# Patient Record
Sex: Female | Born: 1975 | Race: Black or African American | Hispanic: No | Marital: Married | Smoking: Never smoker
Health system: Southern US, Community
[De-identification: ages and names within clinical notes are randomized; demographics above are authoritative.]

---

## 2002-10-06 ENCOUNTER — Emergency Department (HOSPITAL_COMMUNITY): Admission: EM | Admit: 2002-10-06 | Discharge: 2002-10-06 | Payer: Self-pay | Admitting: Emergency Medicine

## 2003-09-28 ENCOUNTER — Encounter: Admission: RE | Admit: 2003-09-28 | Discharge: 2004-04-05 | Payer: Self-pay | Admitting: Obstetrics and Gynecology

## 2004-04-29 ENCOUNTER — Encounter: Admission: RE | Admit: 2004-04-29 | Discharge: 2004-11-26 | Payer: Self-pay | Admitting: Obstetrics and Gynecology

## 2005-12-28 ENCOUNTER — Inpatient Hospital Stay (HOSPITAL_COMMUNITY): Admission: AD | Admit: 2005-12-28 | Discharge: 2005-12-29 | Payer: Self-pay | Admitting: Gynecology

## 2006-08-13 ENCOUNTER — Inpatient Hospital Stay (HOSPITAL_COMMUNITY): Admission: AD | Admit: 2006-08-13 | Discharge: 2006-08-15 | Payer: Self-pay | Admitting: Family Medicine

## 2006-08-13 ENCOUNTER — Ambulatory Visit: Payer: Self-pay | Admitting: *Deleted

## 2006-11-08 ENCOUNTER — Encounter: Admission: RE | Admit: 2006-11-08 | Discharge: 2006-12-08 | Payer: Self-pay | Admitting: Obstetrics and Gynecology

## 2006-12-09 ENCOUNTER — Encounter: Admission: RE | Admit: 2006-12-09 | Discharge: 2007-01-07 | Payer: Self-pay | Admitting: Obstetrics and Gynecology

## 2007-01-08 ENCOUNTER — Encounter: Admission: RE | Admit: 2007-01-08 | Discharge: 2007-02-07 | Payer: Self-pay | Admitting: Obstetrics and Gynecology

## 2007-02-08 ENCOUNTER — Encounter: Admission: RE | Admit: 2007-02-08 | Discharge: 2007-03-09 | Payer: Self-pay | Admitting: Obstetrics and Gynecology

## 2007-03-10 ENCOUNTER — Encounter: Admission: RE | Admit: 2007-03-10 | Discharge: 2007-04-09 | Payer: Self-pay | Admitting: Obstetrics and Gynecology

## 2007-04-10 ENCOUNTER — Encounter: Admission: RE | Admit: 2007-04-10 | Discharge: 2007-05-10 | Payer: Self-pay | Admitting: Obstetrics and Gynecology

## 2007-05-11 ENCOUNTER — Encounter: Admission: RE | Admit: 2007-05-11 | Discharge: 2007-06-04 | Payer: Self-pay | Admitting: Obstetrics and Gynecology

## 2007-06-08 ENCOUNTER — Encounter: Admission: RE | Admit: 2007-06-08 | Discharge: 2007-07-08 | Payer: Self-pay | Admitting: Obstetrics and Gynecology

## 2007-07-09 ENCOUNTER — Encounter: Admission: RE | Admit: 2007-07-09 | Discharge: 2007-08-07 | Payer: Self-pay | Admitting: Obstetrics and Gynecology

## 2007-08-08 ENCOUNTER — Encounter: Admission: RE | Admit: 2007-08-08 | Discharge: 2007-09-07 | Payer: Self-pay | Admitting: Obstetrics and Gynecology

## 2007-09-08 ENCOUNTER — Encounter: Admission: RE | Admit: 2007-09-08 | Discharge: 2007-10-07 | Payer: Self-pay | Admitting: Obstetrics and Gynecology

## 2007-10-08 ENCOUNTER — Encounter: Admission: RE | Admit: 2007-10-08 | Discharge: 2007-11-01 | Payer: Self-pay | Admitting: Obstetrics and Gynecology

## 2008-08-04 ENCOUNTER — Emergency Department (HOSPITAL_COMMUNITY): Admission: EM | Admit: 2008-08-04 | Discharge: 2008-08-04 | Payer: Self-pay | Admitting: Emergency Medicine

## 2008-12-03 ENCOUNTER — Encounter (INDEPENDENT_AMBULATORY_CARE_PROVIDER_SITE_OTHER): Payer: Self-pay | Admitting: Specialist

## 2008-12-03 ENCOUNTER — Ambulatory Visit (HOSPITAL_BASED_OUTPATIENT_CLINIC_OR_DEPARTMENT_OTHER): Admission: RE | Admit: 2008-12-03 | Discharge: 2008-12-04 | Payer: Self-pay | Admitting: Specialist

## 2010-07-12 LAB — POCT HEMOGLOBIN-HEMACUE: Hemoglobin: 14.1 g/dL (ref 12.0–15.0)

## 2010-08-19 NOTE — Op Note (Signed)
NAMEGENNIFER, POTENZA                 ACCOUNT NO.:  [id number]   MEDICAL RECORD NO.:  [id number]          PATIENT TYPE:  AMB   LOCATION:  DSC                          FACILITY:  [facility]   PHYSICIAN:  Earvin Hansen L. Shon Hough, M.D.DATE OF BIRTH:  1975-10-06   DATE OF PROCEDURE:  12/03/2008  DATE OF DISCHARGE:                               OPERATIVE REPORT   SURGEON:  Earvin Hansen L. Shon Hough, MD   INDICATIONS:  A 35 year old with severe, severe macromastia; back  shoulder pain secondary to large pendulous breasts.  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  She has failed conservative treatment.   PROCEDURES:  Planned bilateral breast reductions using the inferior  pedicle technique and excision of accessory breast tissue.   ANESTHESIA:  General.   PROCEDURE:  Preoperatively, the patient was sat up and drawn for the  inferior pedicle reduction mammoplasty.  She underwent general  anesthesia, intubated orally.  Prep was done to the chest, breast areas  in a routine fashion using Hibiclens soap and solution, walled off with  sterile towels and drapes so as to make a sterile field.  The areas were  injected with Xylocaine with epinephrine one-fourth concentration  1:400,000, 50 mL per side.  This was allowed to sit up.  The wounds were  scored with a #15 blade.  The skin of the inferior pedicle was de-  epithelialized with a #20 blade.  Medial and lateral fatty dermal  pedicles were incised down to underlying fascia.  The new keyhole area  was also debulked.  After proper hemostasis, the flaps were advanced and  stayed with 3-0 Prolene.  Accessory breast tissue also removed sharply  laterally.  Subcutaneous closure was done with 3-0 Monocryl x2 layers  and then running subcuticular stitch of 3-0 Monocryl and 5-0 Monocryl  throughout the inverted T.  The wounds were drained with #10 fully  fluted Blake drains, which were placed in the depths of the wound and  brought out through the lateral-most portion  of the incision and secured  with 3-0 Prolene.  The wounds were cleansed.  Steri-Strips and soft  dressing were applied to all the areas.  Nipple-areola complexes were  examined, showing excellent symmetry and suppleness and blood supply.  Steri-Strips, soft dressing, Xeroform, 4 x 4s, ABDs, Hypafix tape were  placed.  She withstood the procedures very well, was taken to recovery  in excellent condition.      Yaakov Guthrie. Shon Hough, M.D.  Electronically Signed     GLT/MEDQ  D:  12/03/2008  T:  12/04/2008  Job:  [id number]

## 2011-07-08 ENCOUNTER — Emergency Department (HOSPITAL_COMMUNITY)
Admission: EM | Admit: 2011-07-08 | Discharge: 2011-07-08 | Disposition: A | Discharge status: Home / Self Care | Payer: BC Managed Care – PPO | Attending: Emergency Medicine | Admitting: Emergency Medicine

## 2011-07-08 ENCOUNTER — Emergency Department (HOSPITAL_COMMUNITY): Payer: BC Managed Care – PPO

## 2011-07-08 ENCOUNTER — Encounter (HOSPITAL_COMMUNITY): Payer: Self-pay | Admitting: Emergency Medicine

## 2011-07-08 DIAGNOSIS — N200 Calculus of kidney: Secondary | ICD-10-CM

## 2011-07-08 DIAGNOSIS — N201 Calculus of ureter: Secondary | ICD-10-CM | POA: Insufficient documentation

## 2011-07-08 DIAGNOSIS — R109 Unspecified abdominal pain: Secondary | ICD-10-CM | POA: Insufficient documentation

## 2011-07-08 LAB — URINALYSIS, ROUTINE W REFLEX MICROSCOPIC
Leukocytes, UA: NEGATIVE
Nitrite: NEGATIVE
Protein, ur: 30 mg/dL — AB
Urobilinogen, UA: 0.2 mg/dL (ref 0.0–1.0)

## 2011-07-08 LAB — URINE MICROSCOPIC-ADD ON

## 2011-07-08 LAB — PREGNANCY, URINE: Preg Test, Ur: NEGATIVE

## 2011-07-08 MED ORDER — HYDROMORPHONE HCL PF 1 MG/ML IJ SOLN
1.0000 mg | Freq: Once | INTRAMUSCULAR | Status: AC
  Administered 2011-07-08: 1 mg via INTRAVENOUS
  Filled 2011-07-08: qty 1

## 2011-07-08 MED ORDER — OXYCODONE-ACETAMINOPHEN 5-325 MG PO TABS: 1.0000 | ORAL_TABLET | Freq: Four times a day (QID) | ORAL | Status: AC | PRN

## 2011-07-08 MED ORDER — ONDANSETRON HCL 4 MG/2ML IJ SOLN
4.0000 mg | Freq: Once | INTRAMUSCULAR | Status: AC
  Administered 2011-07-08: 4 mg via INTRAVENOUS
  Filled 2011-07-08: qty 2

## 2011-07-08 MED ORDER — SODIUM CHLORIDE 0.9 % IV BOLUS (SEPSIS)
1000.0000 mL | Freq: Once | INTRAVENOUS | Status: AC
  Administered 2011-07-08: 1000 mL via INTRAVENOUS

## 2011-07-08 MED ORDER — ONDANSETRON 4 MG PO TBDP
8.0000 mg | ORAL_TABLET | Freq: Once | ORAL | Status: AC
  Administered 2011-07-08: 8 mg via ORAL
  Filled 2011-07-08: qty 2

## 2011-07-08 MED ORDER — KETOROLAC TROMETHAMINE 30 MG/ML IJ SOLN
30.0000 mg | Freq: Once | INTRAMUSCULAR | Status: AC
  Administered 2011-07-08: 30 mg via INTRAVENOUS
  Filled 2011-07-08: qty 1

## 2011-07-08 MED ORDER — ONDANSETRON 4 MG PO TBDP
ORAL_TABLET | ORAL | Status: AC
  Filled 2011-07-08: qty 2

## 2011-07-08 NOTE — Discharge Instructions (Signed)
Return to the ED with any concerns including vomiting, fever/chills, worsening pain, decreased level of alertness/lethargy, or any other alarming symptoms °

## 2011-07-08 NOTE — ED Provider Notes (Signed)
Pt signed out to me by Dr. Adriana Simas- her CT scan show small left sided renal stone that has passed into the bladder.  Pt reassessed, she has no further pain.  Discharged with strict return precautions.  Pt agreeable with plan.  Ethelda Chick, MD 07/08/11 [phone]

## 2011-07-08 NOTE — ED Notes (Signed)
Pt was woken up with left flank pain radiating to leg; nauseated; pain is constant; thought she had to urinate at first but pain became too great. Pt has never had this kind of pain before.

## 2011-07-08 NOTE — ED Provider Notes (Addendum)
History     CSN: [id number]  Arrival date & time 07/08/11  0436   First MD Initiated Contact with Patient 07/08/11 0503      Chief Complaint  Patient presents with  . Flank Pain    (Consider location/radiation/quality/duration/timing/severity/associated sxs/prior treatment) HPI.... abrupt onset left flank pain this evening. Decreased urinary output. No dysuria, fever, chills. No previous history of kidney stone. No abdominal pain. Nothing makes it better or worse. Pain is severe.  No past medical history on file.  No past surgical history on file.  No family history on file.  History  Substance Use Topics  . Smoking status: Not on file  . Smokeless tobacco: Not on file  . Alcohol Use: Not on file    OB History    Grav Para Term Preterm Abortions TAB SAB Ect Mult Living                  Review of Systems  All other systems reviewed and are negative.    Allergies  Review of patient's allergies indicates no known allergies.  Home Medications  No current outpatient prescriptions on file.  BP 113/74  Pulse 87  Temp(Src) 98.2 F (36.8 C) (Oral)  Resp 18  SpO2 100%  Physical Exam  Nursing note and vitals reviewed. Constitutional: She is oriented to person, place, and time. She appears well-developed and well-nourished.  HENT:  Head: Normocephalic and atraumatic.  Eyes: Conjunctivae and EOM are normal. Pupils are equal, round, and reactive to light.  Neck: Normal range of motion. Neck supple.  Cardiovascular: Normal rate and regular rhythm.   Pulmonary/Chest: Effort normal and breath sounds normal.  Abdominal: Soft. Bowel sounds are normal.  Genitourinary:       Left flank tenderness  Musculoskeletal: Normal range of motion.  Neurological: She is alert and oriented to person, place, and time.  Skin: Skin is warm and dry.  Psychiatric: She has a normal mood and affect.    ED Course  Procedures (including critical care time)   Labs Reviewed    PREGNANCY, URINE  URINALYSIS, ROUTINE W REFLEX MICROSCOPIC   No results found.   No diagnosis found.    MDM  Suspect kidney stone. Will get urinalysis and pregnancy test. Treat pain. CT scan of abdomen and pelvis without contrast.  Discussed c Dr Darra Lis, MD 07/08/11 4098  Donnetta Hutching, MD 07/08/11 [phone]

## 2011-09-01 ENCOUNTER — Other Ambulatory Visit: Payer: Self-pay | Admitting: Obstetrics and Gynecology

## 2011-09-01 ENCOUNTER — Other Ambulatory Visit (HOSPITAL_COMMUNITY)
Admission: RE | Admit: 2011-09-01 | Discharge: 2011-09-01 | Disposition: A | Discharge status: Home / Self Care | Payer: BC Managed Care – PPO | Source: Ambulatory Visit | Attending: Obstetrics and Gynecology | Admitting: Obstetrics and Gynecology

## 2011-09-01 DIAGNOSIS — Z124 Encounter for screening for malignant neoplasm of cervix: Secondary | ICD-10-CM | POA: Insufficient documentation

## 2016-08-11 ENCOUNTER — Encounter (HOSPITAL_COMMUNITY): Payer: Self-pay | Admitting: Emergency Medicine

## 2016-08-11 ENCOUNTER — Ambulatory Visit (HOSPITAL_COMMUNITY)
Admission: EM | Admit: 2016-08-11 | Discharge: 2016-08-11 | Disposition: A | Discharge status: Nursing Home (Includes ICF) | Payer: BLUE CROSS/BLUE SHIELD

## 2016-08-11 DIAGNOSIS — R42 Dizziness and giddiness: Secondary | ICD-10-CM

## 2016-08-11 DIAGNOSIS — R110 Nausea: Secondary | ICD-10-CM | POA: Diagnosis not present

## 2016-08-11 NOTE — Discharge Instructions (Signed)
Go to ER

## 2016-08-11 NOTE — ED Provider Notes (Signed)
CSN: [id number]     Arrival date & time 08/11/16  1937 History   None    Chief Complaint  Patient presents with  . Nausea   (Consider location/radiation/quality/duration/timing/severity/associated sxs/prior Treatment) 41 yr old AA female presents to ER with cc of sudden onset of nausea, dizziness and inability to walk straight this afternoon. Pt denies CP or visual disturbance. Pt states gait imbalance and dizziness have resolved, nausea remains. Denies pregnancy, LMP 2 weeks prior and husband had vasectomy. Pt reports chronic anemia of unknown cause taking iron pills twice daily, no labs or follow up of anemia x 2 years.   The history is provided by the patient and a relative. No language interpreter was used.    History reviewed. No pertinent past medical history. History reviewed. No pertinent surgical history. History reviewed. No pertinent family history. Social History  Substance Use Topics  . Smoking status: Never Smoker  . Smokeless tobacco: Never Used  . Alcohol use No   OB History    No data available     Review of Systems  Gastrointestinal: Positive for nausea.  Musculoskeletal: Positive for gait problem.  Neurological: Positive for dizziness.  All other systems reviewed and are negative.   Allergies  Patient has no known allergies.  Home Medications   Prior to Admission medications   Not on File   Meds Ordered and Administered this Visit  Medications - No data to display  BP 119/82 (BP Location: Right Arm)   Pulse 78   Temp 98.5 F (36.9 C) (Oral)   Resp 16   LMP 07/29/2015 (Exact Date)   SpO2 100%  No data found.   Physical Exam  Constitutional: She is oriented to person, place, and time. She appears well-developed and well-nourished. She is active and cooperative.  HENT:  Head: Normocephalic.  Right Ear: Tympanic membrane normal.  Left Ear: Tympanic membrane normal.  Mouth/Throat: Uvula is midline, oropharynx is clear and moist and mucous  membranes are normal.  Eyes: Pupils are equal, round, and reactive to light.  Neck: Normal range of motion.  Cardiovascular: Normal rate, regular rhythm and normal pulses.   Murmur heard. Pulmonary/Chest: Effort normal.  Musculoskeletal: Normal range of motion.  Neurological: She is alert and oriented to person, place, and time.  Skin: Skin is warm.  Nursing note and vitals reviewed.   Urgent Care Course     Procedures (including critical care time)  Labs Review Labs Reviewed - No data to display  Imaging Review No results found.        MDM   1. Nausea   2. Dizziness    Discussed possible DDX: severe anemia, TIA, cardiac event, viral illness, etc. Recommend further evaluation in ER for labs/imaging. Pt verbalized udnerstanding ot thsi provider, discharged to ER, husband accompanied.    Clancy Gourdefelice, Atiba Kimberlin, NP 08/11/16 2146

## 2016-08-11 NOTE — ED Triage Notes (Signed)
[facility] with a complaint of a sudden onset of nausea and dizziness that started this afternoon. The patient denied any dizziness at this time.

## 2019-04-20 ENCOUNTER — Other Ambulatory Visit: Payer: Self-pay

## 2019-04-20 ENCOUNTER — Emergency Department (HOSPITAL_BASED_OUTPATIENT_CLINIC_OR_DEPARTMENT_OTHER)
Admission: EM | Admit: 2019-04-20 | Discharge: 2019-04-20 | Disposition: A | Discharge status: Home / Self Care | Payer: BC Managed Care – PPO | Attending: Emergency Medicine | Admitting: Emergency Medicine

## 2019-04-20 ENCOUNTER — Encounter (HOSPITAL_BASED_OUTPATIENT_CLINIC_OR_DEPARTMENT_OTHER): Payer: Self-pay | Admitting: Emergency Medicine

## 2019-04-20 ENCOUNTER — Emergency Department (HOSPITAL_BASED_OUTPATIENT_CLINIC_OR_DEPARTMENT_OTHER): Payer: BC Managed Care – PPO

## 2019-04-20 DIAGNOSIS — R1031 Right lower quadrant pain: Secondary | ICD-10-CM | POA: Diagnosis present

## 2019-04-20 DIAGNOSIS — N201 Calculus of ureter: Secondary | ICD-10-CM | POA: Diagnosis not present

## 2019-04-20 LAB — URINALYSIS, ROUTINE W REFLEX MICROSCOPIC
Bilirubin Urine: NEGATIVE
Glucose, UA: NEGATIVE mg/dL
Ketones, ur: 15 mg/dL — AB
Leukocytes,Ua: NEGATIVE
Nitrite: NEGATIVE
Protein, ur: NEGATIVE mg/dL
Specific Gravity, Urine: 1.02 (ref 1.005–1.030)
pH: 7.5 (ref 5.0–8.0)

## 2019-04-20 LAB — URINALYSIS, MICROSCOPIC (REFLEX): RBC / HPF: >50 RBC/hpf (ref 0–5)

## 2019-04-20 LAB — PREGNANCY, URINE: Preg Test, Ur: NEGATIVE

## 2019-04-20 MED ORDER — ONDANSETRON HCL 4 MG/2ML IJ SOLN
4.0000 mg | Freq: Once | INTRAMUSCULAR | Status: AC
  Administered 2019-04-20: 4 mg via INTRAVENOUS
  Filled 2019-04-20: qty 2

## 2019-04-20 MED ORDER — ONDANSETRON 8 MG PO TBDP: 8.0000 mg | ORAL_TABLET | Freq: Three times a day (TID) | ORAL | 0 refills | Status: DC | PRN

## 2019-04-20 MED ORDER — HYDROMORPHONE HCL 2 MG PO TABS: 2.0000 mg | ORAL_TABLET | ORAL | 0 refills | Status: DC | PRN

## 2019-04-20 MED ORDER — PROMETHAZINE HCL 25 MG/ML IJ SOLN
12.5000 mg | Freq: Once | INTRAMUSCULAR | Status: AC
  Administered 2019-04-20: 03:00:00 12.5 mg via INTRAVENOUS

## 2019-04-20 MED ORDER — HYDROMORPHONE HCL 1 MG/ML IJ SOLN
1.0000 mg | Freq: Once | INTRAMUSCULAR | Status: AC
  Administered 2019-04-20: 1 mg via INTRAVENOUS
  Filled 2019-04-20: qty 1

## 2019-04-20 MED ORDER — KETOROLAC TROMETHAMINE 15 MG/ML IJ SOLN
15.0000 mg | Freq: Once | INTRAMUSCULAR | Status: AC
  Administered 2019-04-20: 15 mg via INTRAVENOUS
  Filled 2019-04-20: qty 1

## 2019-04-20 MED ORDER — HYDROMORPHONE HCL 1 MG/ML IJ SOLN
1.0000 mg | Freq: Once | INTRAMUSCULAR | Status: AC
  Administered 2019-04-20: 03:00:00 1 mg via INTRAVENOUS
  Filled 2019-04-20: qty 1

## 2019-04-20 MED ORDER — PROMETHAZINE HCL 25 MG/ML IJ SOLN
INTRAMUSCULAR | Status: AC
  Filled 2019-04-20: qty 1

## 2019-04-20 NOTE — ED Triage Notes (Signed)
  Patient comes in with R flank pain and possible kidney stone.  Patient states she has had kidney stones before and it felt like this.  Had same pain a few weeks ago that went away and started yesterday with R flank pain and N/V.  Patient states she was taking 800 mg ibuprofen for pain and last emesis was around 0030.  No painful urination but urine concentrated.  Pain 10/10

## 2019-04-20 NOTE — ED Provider Notes (Signed)
[facility] Provider Note: Georgena Spurling, MD, FACEP  CSN: [id number] MRN: [id number] ARRIVAL: 04/20/19 at 0200 ROOM: MH06/MH06   CHIEF COMPLAINT  Flank Pain   HISTORY OF PRESENT ILLNESS  04/20/19 2:31 AM Kendra Cordova is a 44 y.o. female with a history of ureterolithiasis.  She is here with right flank pain that began about 2 weeks ago.  Subsequently subsided but returned yesterday evening about 6 PM.  She had some transient relief with ibuprofen but the pain worsened about 11 PM.  Again she took ibuprofen but then began vomiting.  She rates her pain is a 10 out of 10 and describes it as throbbing and shooting.  It is similar to previous kidney stone pain.   History reviewed. No pertinent past medical history.  History reviewed. No pertinent surgical history.  History reviewed. No pertinent family history.  Social History   Tobacco Use  . Smoking status: Never Smoker  . Smokeless tobacco: Never Used  Substance Use Topics  . Alcohol use: No  . Drug use: No    Prior to Admission medications   Medication Sig Start Date End Date Taking? Authorizing Provider  HYDROmorphone (DILAUDID) 2 MG tablet Take 1 tablet (2 mg total) by mouth every 4 (four) hours as needed for severe pain. 04/20/19   Paula Zietz, MD  ondansetron (ZOFRAN ODT) 8 MG disintegrating tablet Take 1 tablet (8 mg total) by mouth every 8 (eight) hours as needed for nausea or vomiting. 04/20/19   Makalya Nave, Jenny Reichmann, MD    Allergies Patient has no known allergies.   REVIEW OF SYSTEMS  Negative except as noted here or in the History of Present Illness.   PHYSICAL EXAMINATION  Initial Vital Signs Blood pressure (!) 142/90, pulse 92, temperature 98.8 F (37.1 C), temperature source Oral, resp. rate 20, height 5\' 1"  (1.549 m), weight 68 kg, last menstrual period 03/29/2019, SpO2 100 %.  Examination General: Well-developed, well-nourished female in no acute distress; appearance consistent with age of record  HENT: normocephalic; atraumatic Eyes: Normal appearance Neck: supple Heart: regular rate and rhythm Lungs: clear to auscultation bilaterally Abdomen: soft; nondistended; nontender; bowel sounds present GU: No CVA tenderness Extremities: No deformity; full range of motion Neurologic: Awake, alert and oriented; motor function intact in all extremities and symmetric; no facial droop Skin: Warm and dry Psychiatric: Grimacing   RESULTS  Summary of this visit's results, reviewed and interpreted by myself:   EKG Interpretation  Date/Time:    Ventricular Rate:    PR Interval:    QRS Duration:   QT Interval:    QTC Calculation:   R Axis:     Text Interpretation:        Laboratory Studies: Results for orders placed or performed during the hospital encounter of 04/20/19 (from the past 24 hour(s))  Urinalysis, Routine w reflex microscopic     Status: Abnormal   Collection Time: 04/20/19  3:08 AM  Result Value Ref Range   Color, Urine YELLOW YELLOW   APPearance CLOUDY (A) CLEAR   Specific Gravity, Urine 1.020 1.005 - 1.030   pH 7.5 5.0 - 8.0   Glucose, UA NEGATIVE NEGATIVE mg/dL   Hgb urine dipstick LARGE (A) NEGATIVE   Bilirubin Urine NEGATIVE NEGATIVE   Ketones, ur 15 (A) NEGATIVE mg/dL   Protein, ur NEGATIVE NEGATIVE mg/dL   Nitrite NEGATIVE NEGATIVE   Leukocytes,Ua NEGATIVE NEGATIVE  Pregnancy, urine     Status: None   Collection Time: 04/20/19  3:08 AM  Result Value Ref Range   Preg Test, Ur NEGATIVE NEGATIVE  Urinalysis, Microscopic (reflex)     Status: Abnormal   Collection Time: 04/20/19  3:08 AM  Result Value Ref Range   RBC / HPF >50 0 - 5 RBC/hpf   WBC, UA 0-5 0 - 5 WBC/hpf   Bacteria, UA MANY (A) NONE SEEN   Squamous Epithelial / LPF 0-5 0 - 5   Imaging Studies: CT Renal Stone Study  Result Date: 04/20/2019 CLINICAL DATA:  Right-sided flank pain EXAM: CT ABDOMEN AND PELVIS WITHOUT CONTRAST TECHNIQUE: Multidetector CT imaging of the abdomen and pelvis was  performed following the standard protocol without IV contrast. COMPARISON:  None. FINDINGS: Lower chest: The visualized heart size within normal limits. No pericardial fluid/thickening. No hiatal hernia. Mild bibasilar dependent atelectasis is seen. Hepatobiliary: Although limited due to the lack of intravenous contrast, normal in appearance without gross focal abnormality. No evidence of calcified gallstones or biliary ductal dilatation. Pancreas:  Unremarkable.  No surrounding inflammatory changes. Spleen: Normal in size. Although limited due to the lack of intravenous contrast, normal in appearance. Adrenals/Urinary Tract: Both adrenal glands appear normal. There is a punctate calcification upper pole of the right kidney. A 2 mm calculus seen in the distal right ureter at the UVJ. There is mild right pelviectasis seen. Scattered multiple small calcifications seen within the left kidney the largest measuring 2 mm in the lower pole. The bladder is decompressed. Stomach/Bowel: The stomach, small bowel, and colon are normal in appearance. No inflammatory changes or obstructive findings. appendix is normal. Vascular/Lymphatic: There are no enlarged abdominal or pelvic lymph nodes. No significant gross vascular findings are present. Reproductive: The uterus and adnexa are unremarkable. Other: Small fat containing anterior umbilical hernia noted. Musculoskeletal: No acute or significant osseous findings. IMPRESSION: 2 mm distal right ureteral calculus at the UVJ causing mild right pelviectasis. Nonobstructing multiple left renal calculi. Electronically Signed   By: Jonna Clark M.D.   On: 04/20/2019 03:59    ED COURSE and MDM  Nursing notes, initial and subsequent vitals signs, including pulse oximetry, reviewed and interpreted by myself.  Vitals:   04/20/19 0208 04/20/19 0209 04/20/19 0300 04/20/19 0445  BP: (!) 142/90  107/72 140/88  Pulse: 92  61 90  Resp: 20   20  Temp: 98.8 F (37.1 C)   98.4 F (36.9  C)  TempSrc: Oral   Oral  SpO2: 100%  (!) 82% 98%  Weight:  68 kg    Height:  5\' 1"  (1.549 m)     Medications  HYDROmorphone (DILAUDID) injection 1 mg (has no administration in time range)  ketorolac (TORADOL) 15 MG/ML injection 15 mg (has no administration in time range)  ondansetron (ZOFRAN) injection 4 mg (4 mg Intravenous Given 04/20/19 0231)  HYDROmorphone (DILAUDID) injection 1 mg (1 mg Intravenous Given 04/20/19 0231)  promethazine (PHENERGAN) injection 12.5 mg (12.5 mg Intravenous Given 04/20/19 0320)   4:59 AM Pain and nausea well controlled at this time.  Stone is small enough the patient will likely pass it without intervention but we will refer to urology  PROCEDURES  Procedures   ED DIAGNOSES     ICD-10-CM   1. Ureterolithiasis  N20.1        Zivah Mayr, MD 04/20/19 [phone]

## 2019-06-08 ENCOUNTER — Other Ambulatory Visit: Payer: Self-pay

## 2019-06-08 ENCOUNTER — Ambulatory Visit: Payer: BC Managed Care – PPO | Attending: Family

## 2019-06-08 DIAGNOSIS — Z23 Encounter for immunization: Secondary | ICD-10-CM | POA: Insufficient documentation

## 2019-06-08 NOTE — Progress Notes (Signed)
   Covid-19 Vaccination Clinic  Name:  Kendra Cordova    MRN: [id number] DOB: 1975-05-13  06/08/2019  Kendra Cordova was observed post Covid-19 immunization for 15 minutes without incident. She was provided with Vaccine Information Sheet and instruction to access the V-Safe system.   Kendra Cordova was instructed to call 911 with any severe reactions post vaccine: Marland Kitchen Difficulty breathing  . Swelling of face and throat  . A fast heartbeat  . A bad rash all over body  . Dizziness and weakness   Immunizations Administered    Name Date Dose VIS Date Route   Moderna COVID-19 Vaccine 06/08/2019  3:08 PM 0.5 mL 03/07/2019 Intramuscular   Manufacturer: Moderna   Lot: [id number]   NDC: 87681-157-26

## 2019-07-03 ENCOUNTER — Other Ambulatory Visit: Payer: Self-pay | Admitting: Family Medicine

## 2019-07-03 DIAGNOSIS — R1032 Left lower quadrant pain: Secondary | ICD-10-CM

## 2019-07-03 DIAGNOSIS — R3915 Urgency of urination: Secondary | ICD-10-CM

## 2019-07-05 ENCOUNTER — Other Ambulatory Visit: Payer: Self-pay

## 2019-07-05 ENCOUNTER — Ambulatory Visit (INDEPENDENT_AMBULATORY_CARE_PROVIDER_SITE_OTHER): Payer: BC Managed Care – PPO

## 2019-07-05 DIAGNOSIS — R1032 Left lower quadrant pain: Secondary | ICD-10-CM | POA: Diagnosis not present

## 2019-07-05 DIAGNOSIS — R3915 Urgency of urination: Secondary | ICD-10-CM

## 2019-07-05 MED ORDER — IOHEXOL 300 MG/ML  SOLN
100.0000 mL | Freq: Once | INTRAMUSCULAR | Status: AC | PRN
  Administered 2019-07-05: 09:00:00 100 mL via INTRAVENOUS

## 2019-07-11 ENCOUNTER — Ambulatory Visit: Payer: BC Managed Care – PPO | Attending: Family

## 2019-07-11 DIAGNOSIS — Z23 Encounter for immunization: Secondary | ICD-10-CM

## 2019-07-11 NOTE — Progress Notes (Signed)
   Covid-19 Vaccination Clinic  Name:  Vanessia Bokhari    MRN: [id number] DOB: 1975-06-30  07/11/2019  Ms. Gulbranson was observed post Covid-19 immunization for 15 minutes without incident. She was provided with Vaccine Information Sheet and instruction to access the V-Safe system.   Ms. Hashimi was instructed to call 911 with any severe reactions post vaccine: Marland Kitchen Difficulty breathing  . Swelling of face and throat  . A fast heartbeat  . A bad rash all over body  . Dizziness and weakness   Immunizations Administered    Name Date Dose VIS Date Route   Moderna COVID-19 Vaccine 07/11/2019  1:41 PM 0.5 mL 03/07/2019 Intramuscular   Manufacturer: Moderna   Lot: [id number]   NDC: 45364-680-32

## 2020-02-07 ENCOUNTER — Ambulatory Visit: Payer: BC Managed Care – PPO | Attending: Family

## 2020-02-07 DIAGNOSIS — Z23 Encounter for immunization: Secondary | ICD-10-CM

## 2020-04-20 NOTE — Progress Notes (Signed)
   Covid-19 Vaccination Clinic  Name:  Kendra Cordova    MRN: [id number] DOB: 1975/12/28  04/20/2020  [name] was observed post Covid-19 immunization for 15 minutes without incident. She was provided with Vaccine Information Sheet and instruction to access the V-Safe system.   [name] was instructed to call 911 with any severe reactions post vaccine: Marland Kitchen Difficulty breathing  . Swelling of face and throat  . A fast heartbeat  . A bad rash all over body  . Dizziness and weakness   Immunizations Administered    Name Date Dose VIS Date Route   Moderna Covid-19 Booster Vaccine 02/07/2020  5:50 PM 0.25 mL 01/24/2020 Intramuscular   Manufacturer: Moderna   Lot: [id number]   NDC: 62376-283-15

## 2021-03-06 ENCOUNTER — Ambulatory Visit (INDEPENDENT_AMBULATORY_CARE_PROVIDER_SITE_OTHER): Payer: BC Managed Care – PPO | Admitting: Family Medicine

## 2021-03-06 ENCOUNTER — Encounter: Payer: Self-pay | Admitting: Family Medicine

## 2021-03-06 VITALS — BP 104/70 | Ht 63.0 in | Wt 150.0 lb

## 2021-03-06 DIAGNOSIS — M20012 Mallet finger of left finger(s): Secondary | ICD-10-CM | POA: Diagnosis not present

## 2021-03-06 NOTE — Progress Notes (Signed)
  Kendra Cordova - 45 y.o. female MRN [id number]  Date of birth: Jun 28, 1975  SUBJECTIVE:  Including CC & ROS.  No chief complaint on file.   Kendra Cordova is a 45 y.o. female that is presenting with right pinky finger pain.  The pain has been present for a couple of days.  She was fixing her hair and felt a pop in her finger.  Since that time she has had inability to extend the distal phalanx of the left digit.   Review of Systems See HPI   HISTORY: Past Medical, Surgical, Social, and Family History Reviewed & Updated per EMR.   Pertinent Historical Findings include:  History reviewed. No pertinent past medical history.  History reviewed. No pertinent surgical history.  History reviewed. No pertinent family history.  Social History   Socioeconomic History   Marital status: Married    Spouse name: Not on file   Number of children: Not on file   Years of education: Not on file   Highest education level: Not on file  Occupational History   Not on file  Tobacco Use   Smoking status: Never   Smokeless tobacco: Never  Vaping Use   Vaping Use: Never used  Substance and Sexual Activity   Alcohol use: No   Drug use: No   Sexual activity: Not on file  Other Topics Concern   Not on file  Social History Narrative   Not on file   Social Determinants of Health   Financial Resource Strain: Not on file  Food Insecurity: Not on file  Transportation Needs: Not on file  Physical Activity: Not on file  Stress: Not on file  Social Connections: Not on file  Intimate Partner Violence: Not on file     PHYSICAL EXAM:  VS: BP 104/70 (BP Location: Left Arm, Patient Position: Sitting)   Ht 5\' 3"  (1.6 m)   Wt 150 lb (68 kg)   BMI 26.57 kg/m  Physical Exam Gen: NAD, alert, cooperative with exam, well-appearing     ASSESSMENT & PLAN:   Mallet finger, left Occurring at the fifth digit.   -Counseled on home exercise therapy and supportive care. -Exam splint was placed today.   However the splint does not allow the PIP to flex and extend. -Refer to occupational therapy for a custom splint.

## 2021-03-06 NOTE — Assessment & Plan Note (Signed)
Occurring at the fifth digit.   -Counseled on home exercise therapy and supportive care. -Exam splint was placed today.  However the splint does not allow the PIP to flex and extend. -Refer to occupational therapy for a custom splint.

## 2021-03-06 NOTE — Patient Instructions (Signed)
Nice to meet you Please continue the brace   Please send me a message in MyChart with any questions or updates.  Please see me back in 6 weeks.   --Dr. Jordan Likes

## 2021-03-10 ENCOUNTER — Other Ambulatory Visit: Payer: Self-pay

## 2021-03-10 ENCOUNTER — Ambulatory Visit: Payer: BC Managed Care – PPO | Attending: Family Medicine | Admitting: Occupational Therapy

## 2021-03-10 ENCOUNTER — Encounter: Payer: Self-pay | Admitting: Occupational Therapy

## 2021-03-10 DIAGNOSIS — M79645 Pain in left finger(s): Secondary | ICD-10-CM | POA: Diagnosis present

## 2021-03-10 DIAGNOSIS — R29898 Other symptoms and signs involving the musculoskeletal system: Secondary | ICD-10-CM | POA: Diagnosis present

## 2021-03-10 DIAGNOSIS — M25342 Other instability, left hand: Secondary | ICD-10-CM | POA: Diagnosis not present

## 2021-03-10 NOTE — Therapy (Signed)
[facility] [address] [facility]. [place], [place], [address] Phone: [phone]   Fax:  [phone]  Occupational Therapy Evaluation  Patient Details  Name: Kendra Cordova MRN: [id number] Date of Birth: 15-Jun-1975 Referring Provider (OT): Clare Gandy, MD   Encounter Date: 03/10/2021   OT End of Session - 03/10/21 1110     Visit Number 1    Number of Visits 1    Authorization Type BCBS    Authorization - Visit Number 1    Authorization - Number of Visits 60    OT Start Time 1102    OT Stop Time 1154    OT Time Calculation (min) 52 min    Activity Tolerance Patient tolerated treatment well;No increased pain    Behavior During Therapy [facility] for tasks assessed/performed            History reviewed. No pertinent past medical history.  History reviewed. No pertinent surgical history.  There were no vitals filed for this visit.   Subjective Assessment - 03/10/21 1109     Subjective  Pt arrives to session w/ primary concerns related to mallet finger injury that occurred about a week ago. Pt states she reached up to scratch her head and both her and her coworker heard a pop. She reports previous having a prior mallet finger injury to contralateral side (R) about 3-4 years ago and that she still has the custom-fabricated orthosis that were made for her at that time. Pt also self-purchased options (small 2-pack stack splint and Oval-8 sizes 2-4) and reports she will wear whichever one fits the best.    Pertinent History Mallet finger injury to L little finger (04/02/21)    Limitations Mallet finger injury to R little finger 3-4 years ago    Patient Stated Goals Pt reports she wants to wear whatever brace she needs to to promote healing and that she would like to be able to type w/out significant difficulty for work    Currently in Pain? No/denies             [facility] OT Assessment - 03/10/21 1112       Assessment   Medical Diagnosis  Mallet finger - L little finger    Referring Provider (OT) Clare Gandy, MD    Onset Date/Surgical Date 04/02/21    Hand Dominance Right    Next MD Visit 04/17/21      Precautions   Precautions Other (comment)    Precaution Comments No DIP flexion    Required Braces or Orthoses Other Brace/Splint    Other Brace/Splint Mallet finger orthosis      Prior Function   Level of Independence Independent    Vocation Full time employment    Engineer, agricultural; primarily computer work      ADL   ADL comments Reports no difficulties w/ ADLs      IADL   Light Housekeeping Does personal laundry completely;Maintains house alone or with occasional assistance    Meal Prep Plans, prepares and serves adequate meals independently    Training and development officer own vehicle      Observation/Other Assessments   Observations Demonstrated difficulty typing while wearing custom-fabricated orthosis      Sensation   Light Touch Appears Intact    Hot/Cold Appears Intact    Additional Comments Reports persistent cold fingertip of injured finger      Coordination   Fine Motor Movements are Fluid and Coordinated Yes  Edema   Edema WNL - middle phalanx: 4.2cm; distal 3.9cm      AROM   Overall AROM  Deficits;Unable to assess;Due to precautions      Hand Function   Right Hand Gross Grasp Functional    Left Hand Gross Grasp Functional             OT Treatments/Exercises (OP) - 03/10/21 1304       Splinting   Splinting Fabricated and fitted doral-volar mallet orthosis for L little finger using 3/32" solid thermoplastic material. Pt positioned w/ DIP extended to neutral and was able to flex and extend PIPJ w/out difficulty. Pt instructed to wear orthosis at all times, per MD instruction, educated on care of orthosis, and cautioned to monitor skin for pressure areas or signs of irritation/improper fit; pt verbalized understanding. OT also fit and provided corresponding education  regarding self-purchased prefabricated polypropylene Oval-8 orthosis size 2-3; pt is able to type more efficiently for completion of work-related tasks w/ Oval-8 vs. custom-fabricated orthosis             OT Education - 03/10/21 1226     Education Details Provided condition-specific education and education on purpose of orthosis/precautions, wear and care of orthosis, as well as potential signs and symptoms of irritation or inadequate fit to be aware of    Person(s) Educated Patient    Methods Explanation;Demonstration    Comprehension Verbalized understanding             OT Short Term Goals - 03/10/21 1301       OT SHORT TERM GOAL #1   Title Pt will demonstrate ability to don and doff custom-fabricated orthosis prior to conclusion of session    Status Achieved   03/10/21   Target Date 03/10/21      OT SHORT TERM GOAL #2   Title Pt to verbalize understanding of precautions and wear and care of orthoses prn    Status Achieved   03/10/21   Target Date 03/10/21             Plan - 03/10/21 1229     Clinical Impression Statement Pt is a 45 y.o. female who presents to OP OT 1 week s/p mallet finger injury to L little finger. Pt was referred by Dr. Jordan Likes for fabrication of custom-fabricated mallet finger orthosis to prevent DIP flexion, provide protection, and facilitate healing for several weeks. Kendra Cordova is being treated non-surgically and was most recently placed in a pre-fabricated stack orthosis via Dr. Jordan Likes on 03/06/21.  Pt was ultimately fit in custom dorsal-volar mallet orthosis and self-purchased prefabricatred polypropylene Oval-8 (size 2-3) orthosis to wear while at work; both maintain DIP in neutral to slight extension. After immobilization period, pt will likely not need to return to skilled OT services, but was encouraged to discuss this w/ her MD at follow-up appt on 04/17/21. Pt verbalized understanding and is agreeable to POC at this time.    OT Occupational  Profile and History Problem Focused Assessment - Including review of records relating to presenting problem    Occupational performance deficits (Please refer to evaluation for details): IADL's;Work    Body Structure / Function / Physical Skills Strength;Pain;Dexterity;ROM    Rehab Potential Excellent    Clinical Decision Making Limited treatment options, no task modification necessary    Comorbidities Affecting Occupational Performance: None    Modification or Assistance to Complete Evaluation  No modification of tasks or assist necessary to complete eval    OT  Frequency One time visit    OT Treatment/Interventions Splinting;Patient/family education;Self-care/ADL training    Consulted and Agree with Plan of Care Patient            Patient will benefit from skilled therapeutic intervention in order to improve the following deficits and impairments:   Body Structure / Function / Physical Skills: Strength, Pain, Dexterity, ROM   Visit Diagnosis: Other instability, left hand  Pain in left finger(s)  Other symptoms and signs involving the musculoskeletal system   Problem List Patient Active Problem List   Diagnosis Date Noted   Mallet finger, left 03/06/2021    Kendra Cordova, Kendra Cordova, MSOT  03/10/2021, 1:08 PM  [facility] [address] [facility] [place], [place], [address] Phone: [phone]   Fax:  [phone]  Name: Bassy Fetterly MRN: [id number] Date of Birth: January 10, 1976

## 2021-04-17 ENCOUNTER — Ambulatory Visit: Payer: BC Managed Care – PPO | Admitting: Family Medicine

## 2021-06-16 ENCOUNTER — Ambulatory Visit: Payer: BC Managed Care – PPO | Admitting: Internal Medicine

## 2021-06-16 ENCOUNTER — Encounter: Payer: Self-pay | Admitting: Internal Medicine

## 2021-06-16 ENCOUNTER — Other Ambulatory Visit: Payer: Self-pay

## 2021-06-16 VITALS — BP 114/78 | HR 73 | Temp 98.3°F | Resp 18 | Ht 63.0 in | Wt 154.2 lb

## 2021-06-16 DIAGNOSIS — L299 Pruritus, unspecified: Secondary | ICD-10-CM | POA: Diagnosis not present

## 2021-06-16 DIAGNOSIS — J3089 Other allergic rhinitis: Secondary | ICD-10-CM

## 2021-06-16 DIAGNOSIS — J310 Chronic rhinitis: Secondary | ICD-10-CM | POA: Diagnosis not present

## 2021-06-16 DIAGNOSIS — H1013 Acute atopic conjunctivitis, bilateral: Secondary | ICD-10-CM | POA: Diagnosis not present

## 2021-06-16 DIAGNOSIS — J302 Other seasonal allergic rhinitis: Secondary | ICD-10-CM | POA: Insufficient documentation

## 2021-06-16 MED ORDER — IPRATROPIUM BROMIDE 0.03 % NA SOLN: 2.0000 | Freq: Three times a day (TID) | NASAL | 12 refills | Status: AC | PRN

## 2021-06-16 NOTE — Progress Notes (Signed)
? ?NEW PATIENT ?Date of Service/Encounter:  06/16/21 ?Referring provider: Faustino Congress, NP ?Primary care provider: Marda Stalker, PA-C ? ?Subjective:  ?Kendra Cordova is a 46 y.o. female with a PMHx of migraines with aura, chronic throat clearing presenting today for evaluation of itching. ?History obtained from: chart review and patient. ?  ?Itching occurs on her necks, abdomen and arms.  Comes and goes.  She started taking xyzal for this and it helped.  She does get a rash but not sure if this is from her scratching. ?She always has drainage, and throat clearing.  ?She does cough up clear phlegm in the mornings on occasion, but throat clearing is all day. ?She does have watery eyes, possible itchy nose/runny nose.  ?Symptoms are year round. ? ?Other allergy screening: ?Asthma: no ?Food allergy: no ?Medication allergy: no ?Hymenoptera allergy: no ?Urticaria: no ?Eczema:no ?History of recurrent infections suggestive of immunodeficency: no ?Vaccinations are up to date.  ? ?Past Medical History: ?History reviewed. No pertinent past medical history. ?Medication List:  ?Current Outpatient Medications  ?Medication Sig Dispense Refill  ? levocetirizine (XYZAL) 5 MG tablet SMARTSIG:1 Tablet(s) By Mouth Every Evening    ? ?No current facility-administered medications for this visit.  ? ?Known Allergies:  ?No Known Allergies ?Past Surgical History: ?Past Surgical History:  ?Procedure Laterality Date  ? BREAST REDUCTION SURGERY  2013  ? ?Family History: ?Family History  ?Problem Relation Age of Onset  ? Asthma Father   ? ?Social History: Kendra Cordova lives in a house built in 2014, no water damage, carpet in the bedroom, gas and electric heating, central AC, no pets, no cockroaches, not using dust mite protection on the bedding or pillows, no smoke exposure.  She works as a Careers adviser.  Unsure if home has HEPA filters.  ? ?ROS:  ?All other systems negative except as noted per HPI. ? ?Objective:  ?Blood pressure  114/78, pulse 73, temperature 98.3 ?F (36.8 ?C), temperature source Temporal, resp. rate 18, height 5\' 3"  (1.6 m), weight 154 lb 3.2 oz (69.9 kg), SpO2 99 %. ?Body mass index is 27.32 kg/m?Marland Kitchen ?Physical Exam: ? ?General Appearance:  Alert, cooperative, no distress, appears stated age  ?Head:  Normocephalic, without obvious abnormality, atraumatic  ?Eyes:  Conjunctiva clear, EOM's intact  ?Nose: Nares normal, hypertrophic turbinates, normal mucosa, and no visible anterior polyps  ?Throat: Lips, tongue normal; teeth and gums normal, normal posterior oropharynx and + cobblestoning  ?Neck: Supple, symmetrical  ?Lungs:   clear to auscultation bilaterally, Respirations unlabored, no coughing  ?Heart:  regular rate and rhythm and no murmur, Appears well perfused  ?Extremities: No edema  ?Skin: Skin color, texture, turgor normal, no rashes or lesions on visualized portions of skin  ?Neurologic: No gross deficits  ? ? ? ?Diagnostics: ?Skin Testing: Environmental allergy panel ? Adequate controls. ?Results discussed with patient/family. ? Airborne Adult Perc - 06/16/21 1402   ? ? Time Antigen Placed 1402   Simultaneous filing. User may not have seen previous data.  ? Allergen Manufacturer Greer   Simultaneous filing. User may not have seen previous data.  ? Location Back   Simultaneous filing. User may not have seen previous data.  ? Number of Test 59   Simultaneous filing. User may not have seen previous data.  ? Panel 1 Select   Simultaneous filing. User may not have seen previous data.  ? 1. Control-Buffer 50% Glycerol Negative   ? 2. Control-Histamine 1 mg/ml 4+   ? 3. Albumin saline Negative   ?  4. Grinnell Negative   ? 5. [place] Negative   ? 6. Johnson Negative   ? 7. Patillas Blue Negative   ? 8. Meadow Fescue Negative   ? 9. Perennial Rye Negative   ? 10. Sweet Vernal Negative   ? 11. Timothy Negative   ? 12. Cocklebur Negative   ? 13. Burweed Marshelder Negative   ? 14. Ragweed, short Negative   ? 15. Ragweed, Giant  Negative   ? 16. Plantain,  English Negative   ? 17. Lamb's Quarters Negative   ? 18. Sheep Sorrell Negative   ? 19. Rough Pigweed Negative   ? 20. Marsh Elder, Rough Negative   ? 21. Mugwort, Common Negative   ? 22. Ash mix 3+   ? 23. Wendee Copp mix Negative   ? 24. Beech American Negative   ? 25. Box, Elder 3+   ? 26. Cedar, red Negative   ? 27. Cottonwood, Eastern 3+   ? 28. Elm mix 3+   ? 29. Hickory Negative   ? 30. Maple mix Negative   ? 31. Oak, Russian [place] mix Negative   ? 32. Pecan Pollen Negative   ? 33. Pine mix Negative   ? 34. Sycamore Eastern Negative   ? 35. Walnut, Black Pollen Negative   ? 36. Alternaria alternata Negative   ? 54. Cladosporium Herbarum Negative   ? 38. Aspergillus mix Negative   ? 39. Penicillium mix Negative   ? 40. Bipolaris sorokiniana (Helminthosporium) Negative   ? 41. Drechslera spicifera (Curvularia) Negative   ? 42. Mucor plumbeus Negative   ? 43. Fusarium moniliforme Negative   ? 44. Aureobasidium pullulans (pullulara) Negative   ? 45. Rhizopus oryzae Negative   ? 46. Botrytis cinera Negative   ? 47. Epicoccum nigrum Negative   ? 48. Phoma betae Negative   ? 49. Candida Albicans Negative   ? 50. Trichophyton mentagrophytes Negative   ? 51. Mite, D Farinae  5,000 AU/ml 3+   ? 52. Mite, D Pteronyssinus  5,000 AU/ml 2+   ? 53. Cat Hair 10,000 BAU/ml Negative   ? 54.  Dog Epithelia Negative   ? 55. Mixed Feathers Negative   ? 56. Horse Epithelia Negative   ? 57. Cockroach, [place] Negative   ? 58. Mouse Negative   ? 59. Tobacco Leaf Negative   ? ?  ?  ? ?  ? ? ?Allergy testing results were read and interpreted by myself, documented by clinical staff. ? ?Assessment and Plan  ?Patient with a history of chronic rhinitis and conjunctivitis with chronic throat clearing in addition to new issue of pruritus without a rash. ?Allergy testing today showed both seasonal and perennial allergens.  Plan as below ? ?Chronic  Rhinitis: seasonal and perennial allergic with Itching (pruritus): ?- allergy  testing today was positive to tree pollens and dust mites ?- allergen avoidance as below ?- Chronic throat clearing handout below ?- Consider Nasal Steroid Spray: Options include Flonase SENSIMIST (fluticasone), Nasocort (triamcinolone), Nasonex (mometasome) 1- 2 sprays in each nostril daily (can buy over-the-counter if not covered by insurance)  Best results if used daily.  ?Avoid regular flonase given your history of headaches. ?- Start Atrovent (Ipratropium Bromide) 1-2 sprays in each nostril up to 3 times a day as needed for runny nose/post nasal drip/drainage.  Use less frequently if airway gets too dry. ?- Continue over the counter antihistamine daily or daily as needed.  Can take 2 on days itching is worst ?-Your options  include Zyrtec (Cetirizine) 10mg , Claritin (Loratadine) 10mg , Allegra (Fexofenadine) 180mg , or Xyzal (Levocetirinze) 5mg  ?Use dye free and fragrance free products, sensitive skin products ?Keep skin moisturized with hypoallergenic ointments like CeraVe, Vaseline, Aveeno, Eucerin, Vanicream, etc ? ?Allergic Conjunctivitis:  ?- Consider Allergy Eye drops: great options include Pataday (Olopatadine) or Zaditor (ketotifen) for eye symptoms daily as needed-both sold over the counter if not covered by insurance.   ?-Avoid eye drops that say red eye relief as they may contain medications that dry out your eyes. ? ?Follow-up in 4 months, sooner if needed.  ?It was a pleasure meeting you in clinic today! ? ?This note in its entirety was forwarded to the Provider who requested this consultation. ? ?Thank you for your kind referral. I appreciate the opportunity to take part in [place] care. Please do not hesitate to contact me with questions. ? ?Sincerely, ? ?Sigurd Sos, MD ?[facility] [place] ? ? ? ? ? ?

## 2021-06-16 NOTE — Patient Instructions (Signed)
Chronic  Rhinitis: seasonal and perennial allergic with Itching (pruritus): ?- allergy testing today was positive to tree pollens and dust mites ?- allergen avoidance as below ?- Chronic throat clearing handout below ?- Consider Nasal Steroid Spray: Options include Flonase SENSIMIST (fluticasone), Nasocort (triamcinolone), Nasonex (mometasome) 1- 2 sprays in each nostril daily (can buy over-the-counter if not covered by insurance)  Best results if used daily.  ?Avoid regular flonase given your history of headaches. ?- Start Atrovent (Ipratropium Bromide) 1-2 sprays in each nostril up to 3 times a day as needed for runny nose/post nasal drip/drainage.  Use less frequently if airway gets too dry. ?- Continue over the counter antihistamine daily or daily as needed.  Can take 2 on days itching is worst ?-Your options include Zyrtec (Cetirizine) 10mg , Claritin (Loratadine) 10mg , Allegra (Fexofenadine) 180mg , or Xyzal (Levocetirinze) 5mg  ?Use dye free and fragrance free products, sensitive skin products ?Keep skin moisturized with hypoallergenic ointments like CeraVe, Vaseline, Aveeno, Eucerin, Vanicream, etc ? ?Allergic Conjunctivitis:  ?- Consider Allergy Eye drops: great options include Pataday (Olopatadine) or Zaditor (ketotifen) for eye symptoms daily as needed-both sold over the counter if not covered by insurance.   ?-Avoid eye drops that say red eye relief as they may contain medications that dry out your eyes. ? ?Follow-up in 4 months, sooner if needed.  ?It was a pleasure meeting you in clinic today! ? ?CHRONIC THROAT CLEARING-WHAT TO DO!! ?Causes of chronic throat clearing may include the following (among others):  ?Acid reflux (lanyngopharyngeal reflux) ?Allergies (pollens, pet dander, dust mites, etc) ?Non allergic rhinitis (runny nose, post nasal drainage or congestion NOT caused by allergies-can be secondary to pollutants, cold air, changes in blood vessels to the nose as we age,etc) ?Environmental  irritants (tobacco, smoke, air pollution) ?Asthma ?If present for a long period of time, throat clearing can become a habit. ?We will work with you to treat any of the medical reasons for throat clearing.  Your job is to help prevent the habit, which can cause damage (redness and swelling) to your vocal cords.  It will require a conscious effort on your behalf. ? ?Tips for prevention of throat clearing:  ?Instead of clearing your throat, swallow instead.   ?Carrying around water (or something to drink) will help you move the mucus in the right direction.  IF you have the urge to clear your throat, drink your water. ?If you absolutely have to clear your throat, use a non-traumatic exercise to do so.   ?Pant with your mouth open saying ?[place], [place], ? with a powerful, breathy voice. ?Increase water intake.  This thins secretions, making them easier to swallow. ?Chew baking soda gum ([employer]) which can help with swallowing, reflux, and throat clearing.  Try to chew up to three times daily.   If you experience jaw pain or headaches, decrease the amount of chewing. ?Suck on sugar free hard candy to help with swallowing. ?Have your friends and family remind you to swallow when they hear you throat clearing.  As this can be habit forming, sometimes you may not realize you are doing this.  Having someone point it out to you, will help you become more conscious of the behavior. ?BE PATIENT.  This will take time to resolve, and some do not see improvement until 8-12 weeks into therapy/behavior modifications. ? ?Reducing Pollen Exposure ? ?The American Academy of Allergy, Asthma and Immunology suggests the following steps to reduce your exposure to pollen during allergy seasons. ?   ?  Do not hang sheets or clothing out to dry; pollen may collect on these items. ?Do not mow lawns or spend time around freshly cut grass; mowing stirs up pollen. ?Keep windows closed at night.  Keep car windows closed while driving. ?Minimize  morning activities outdoors, a time when pollen counts are usually at their highest. ?Stay indoors as much as possible when pollen counts or humidity is high and on windy days when pollen tends to remain in the air longer. ?Use air conditioning when possible.  Many air conditioners have filters that trap the pollen spores. ?Use a HEPA room air filter to remove pollen form the indoor air you breathe. ? ?DUST MITE AVOIDANCE MEASURES: ? ?There are three main measures that need and can be taken to avoid house dust mites: ? ?Reduce accumulation of dust in general ?-reduce furniture, clothing, carpeting, books, stuffed animals, especially in bedroom ? ?Separate yourself from the dust ?-use pillow and mattress encasements (can be found at stores such as [employer] or online) ?-avoid direct exposure to air condition flow ?-use a HEPA filter device, especially in the bedroom; you can also use a HEPA filter vacuum cleaner ?-wipe dust with a moist towel instead of a dry towel or broom when cleaning ? ?Decrease mites and/or their secretions ?-wash clothing and linen and stuffed animals at highest temperature possible, at least every 2 weeks ?-stuffed animals can also be placed in a bag and put in a freezer overnight ? ?Despite the above measures, it is impossible to eliminate dust mites or their allergen completely from your home.  With the above measures the burden of mites in your home can be diminished, with the goal of minimizing your allergic symptoms.  Success will be reached only when implementing and using all means together. ? ?

## 2021-08-16 IMAGING — CT CT RENAL STONE PROTOCOL
2 of 3 series · 15 of 42 positions shown, 19 images · non-contrast
Comparison: None.

CLINICAL DATA: Right-sided flank pain

EXAM:
CT ABDOMEN AND PELVIS WITHOUT CONTRAST
TECHNIQUE: Multidetector CT imaging of the abdomen and pelvis was performed
following the standard protocol without IV contrast.

[Series 2: axial st · axial · 0.72mm/px · z∈[-291,+64]mm · 12 of 82 slices shown, 16 images]
[im 7/82  soft-tissue]
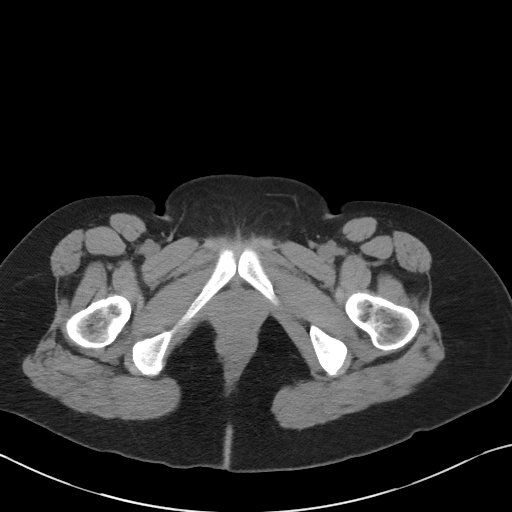
[im 7/82  bone]
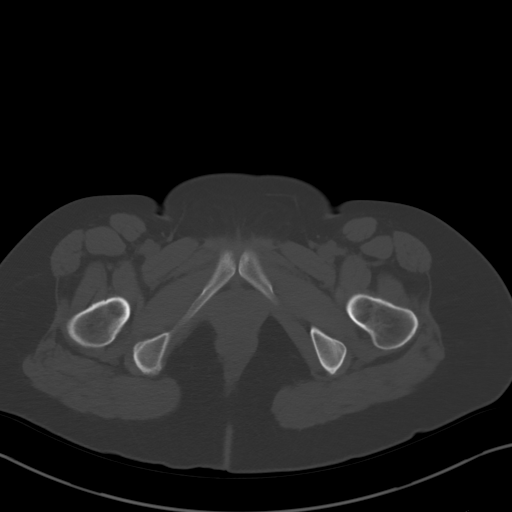
[im 13/82  soft-tissue]
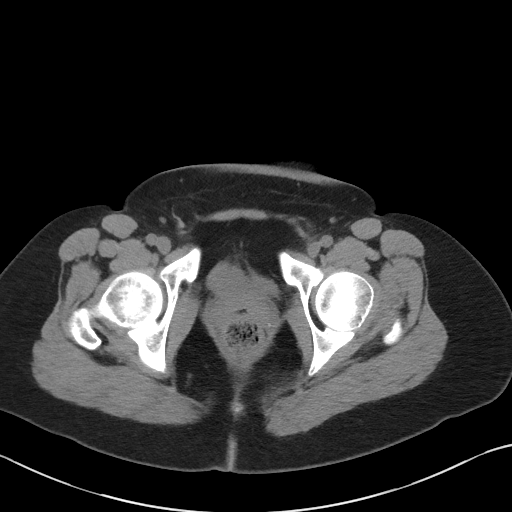
[im 22/82  soft-tissue]
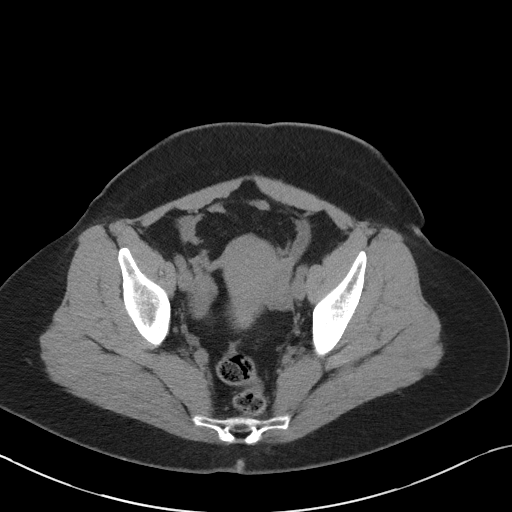
[im 29/82  soft-tissue]
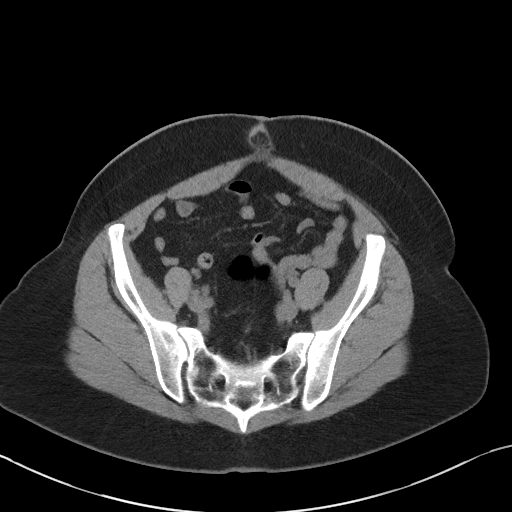
[im 38/82  soft-tissue]
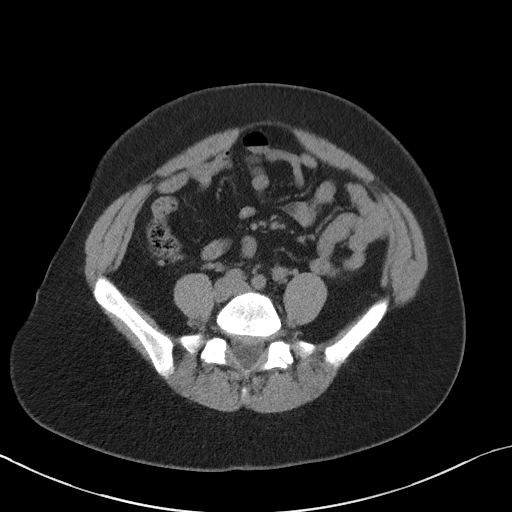
[im 44/82  soft-tissue]
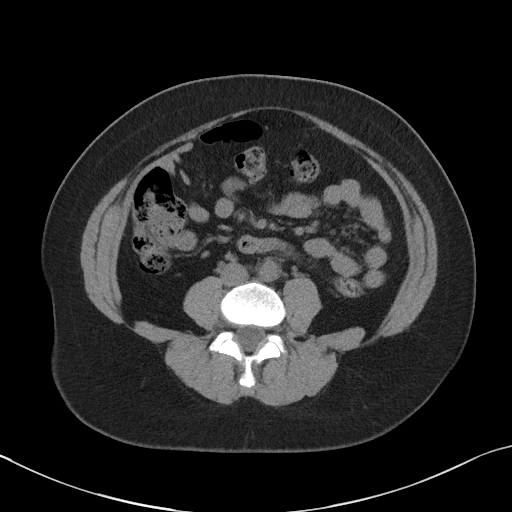
[im 53/82  soft-tissue]
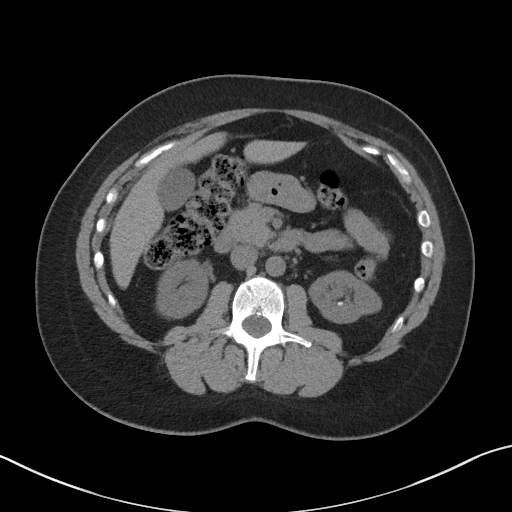
[im 60/82  soft-tissue]
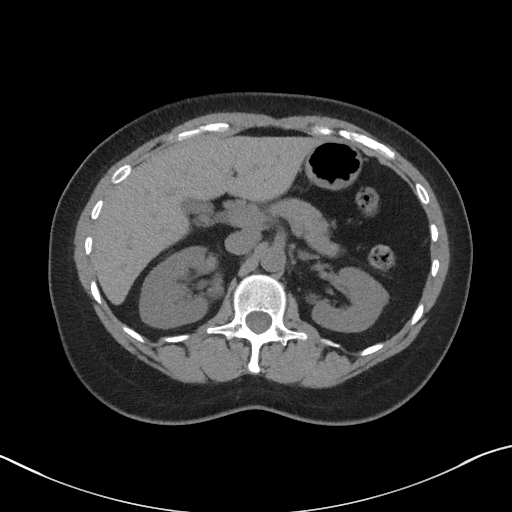
[im 69/82  soft-tissue]
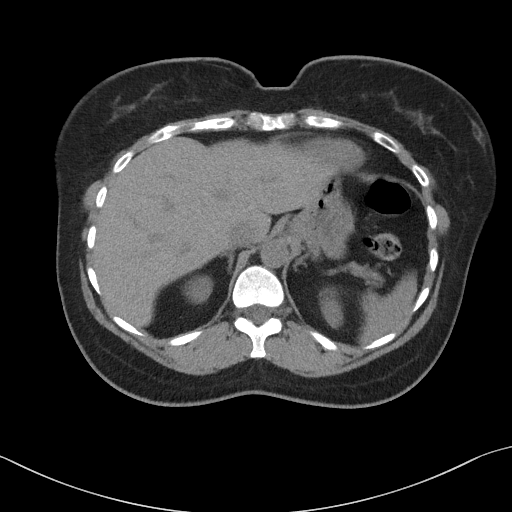
[im 69/82  lung]
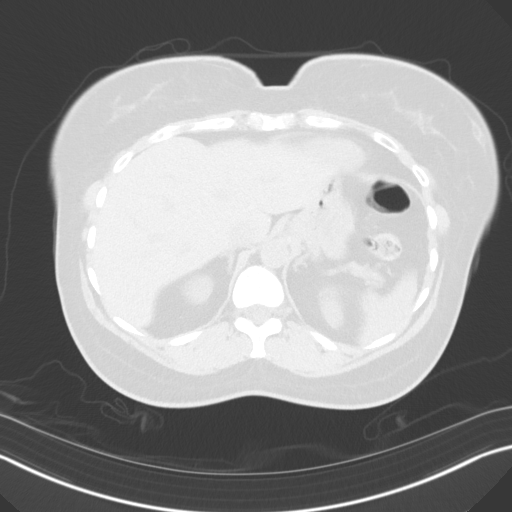
[im 69/82  bone]
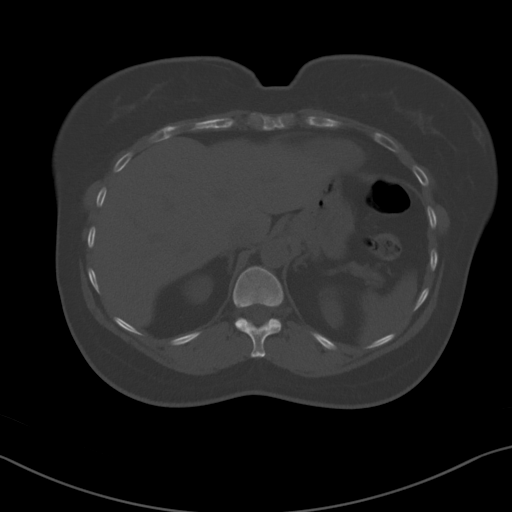
[im 72/82  lung]
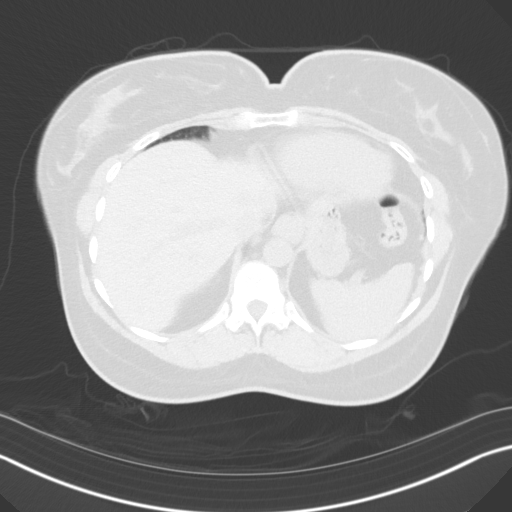
[im 75/82  soft-tissue]
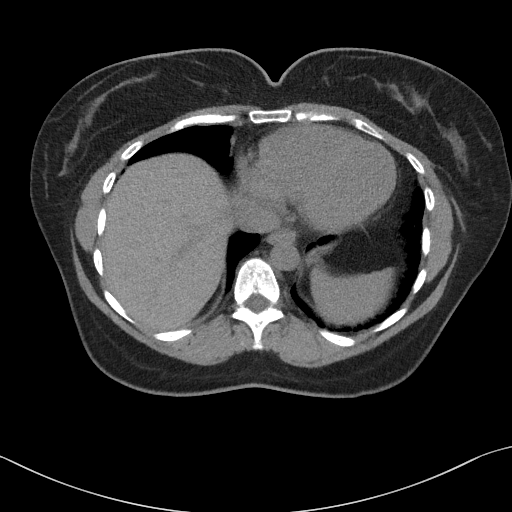
[im 75/82  lung]
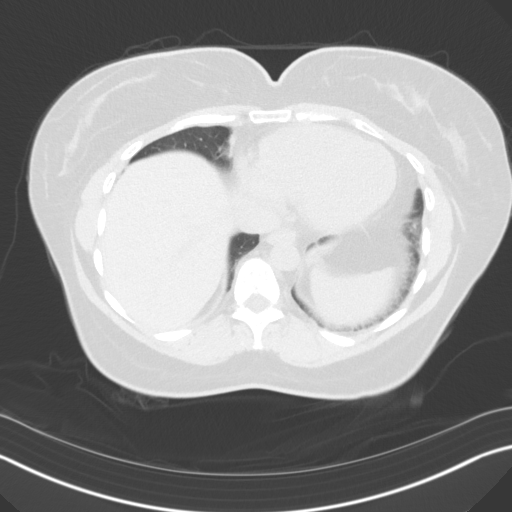
[im 78/82  lung]
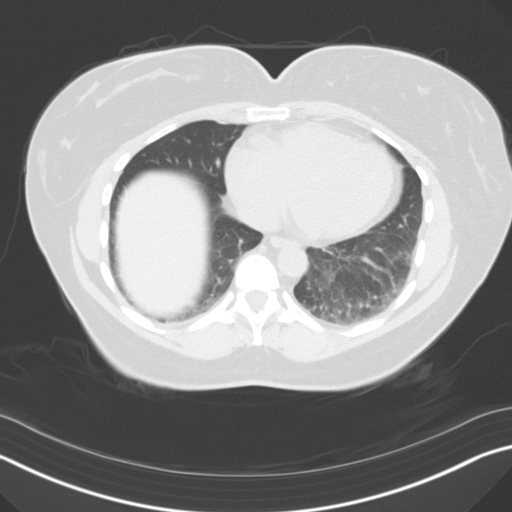

[Series 4: coronal st · coronal · 0.79mm/px · 3 of 92 slices shown]
[im 31/92  soft-tissue]
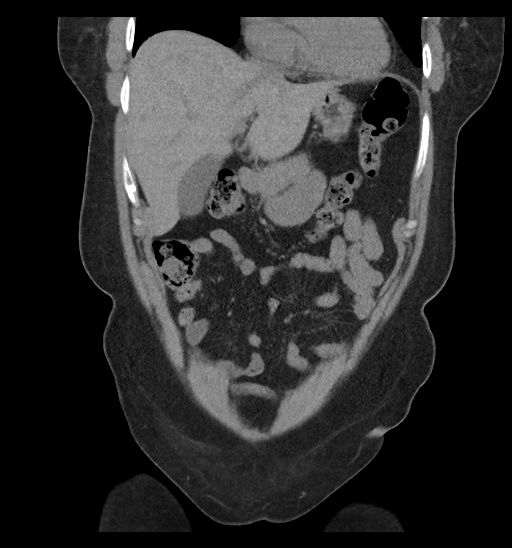
[im 41/92  soft-tissue]
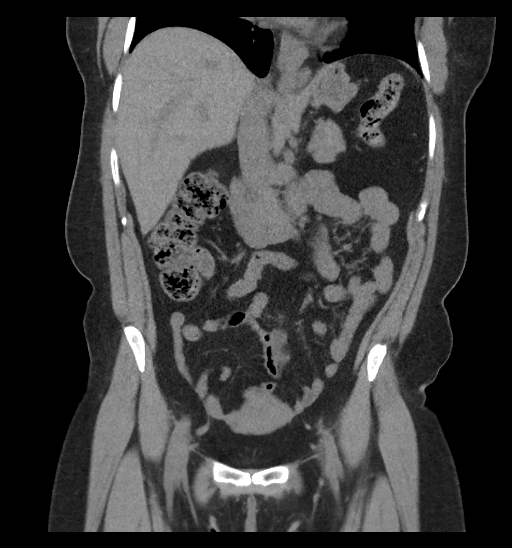
[im 51/92  soft-tissue]
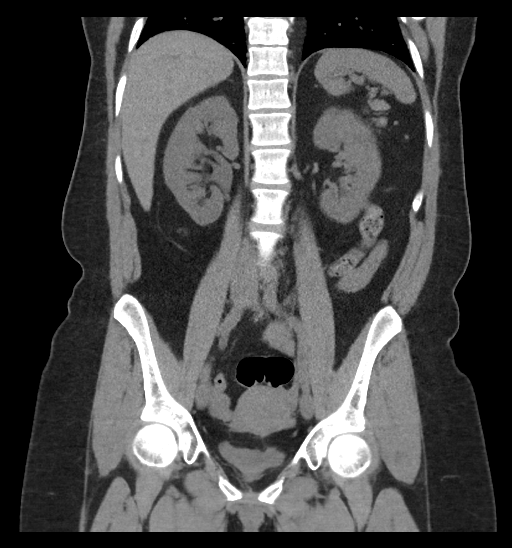

[15 of 42 positions shown; findings below may reference images not displayed]

FINDINGS: Lower chest: The visualized heart size within normal limits. No
pericardial fluid/thickening.

No hiatal hernia.

Mild bibasilar dependent atelectasis is seen.

Hepatobiliary: Although limited due to the lack of intravenous
contrast, normal in appearance without gross focal abnormality. No
evidence of calcified gallstones or biliary ductal dilatation.

Pancreas:  Unremarkable.  No surrounding inflammatory changes.

Spleen: Normal in size. Although limited due to the lack of
intravenous contrast, normal in appearance.

Adrenals/Urinary Tract: Both adrenal glands appear normal. There is
a punctate calcification upper pole of the right kidney. A 2 mm
calculus seen in the distal right ureter at the UVJ. There is mild
right pelviectasis seen. Scattered multiple small calcifications
seen within the left kidney the largest measuring 2 mm in the lower
pole. The bladder is decompressed.

Stomach/Bowel: The stomach, small bowel, and colon are normal in
appearance. No inflammatory changes or obstructive findings.
appendix is normal.

Vascular/Lymphatic: There are no enlarged abdominal or pelvic lymph
nodes. No significant gross vascular findings are present.

Reproductive: The uterus and adnexa are unremarkable.

Other: Small fat containing anterior umbilical hernia noted.

Musculoskeletal: No acute or significant osseous findings.
IMPRESSION: 2 mm distal right ureteral calculus at the UVJ causing mild right
pelviectasis.

Nonobstructing multiple left renal calculi.

## 2021-10-14 NOTE — Progress Notes (Deleted)
   FOLLOW UP Date of Service/Encounter:  10/14/21   Subjective:  Kendra Cordova (DOB: [birth year]) is a 46 y.o. female who [facility] in re-evaluation of the following: allergic rhinitis History obtained from: chart review and {Persons; PED relatives w/patient:19415::"patient"}.  For Review, LV was on 06/16/21  with Dr.Ranita Stjulien seen for intial visit for allergic rhinitis .  We started atrovent and INCS.  Pertinent History/Diagnostics:  - Allergic Rhinitis and conjunctivitis: year-round symptoms, chronic throat clearing, drainage,avoiding flonase due to hx of headaches  - SPT environmental panel (06/16/21): + tree pollen (ash, box elder, cottonwood, elm) and DM  Today presents for follow-up. ***  Allergies as of 10/16/2021   No Known Allergies      Medication List        Accurate as of October 14, 2021  5:14 PM. If you have any questions, ask your nurse or doctor.          ipratropium 0.03 % nasal spray Commonly known as: ATROVENT Place 2 sprays into both nostrils 3 (three) times daily as needed for rhinitis.   levocetirizine 5 MG tablet Commonly known as: XYZAL SMARTSIG:1 Tablet(s) By Mouth Every Evening       No past medical history on file. Past Surgical History:  Procedure Laterality Date   BREAST REDUCTION SURGERY  2013   Otherwise, there have been no changes to her past medical history, surgical history, family history, or social history.  ROS: All others negative except as noted per HPI.   Objective:  There were no vitals taken for this visit. There is no height or weight on file to calculate BMI. Physical Exam: General Appearance:  Alert, cooperative, no distress, appears stated age  Head:  Normocephalic, without obvious abnormality, atraumatic  Eyes:  Conjunctiva clear, EOM's intact  Nose: Nares normal, {Blank multiple:19196:a:"***","hypertrophic turbinates","normal mucosa","no visible anterior polyps","septum  midline"}  Throat: Lips, tongue normal; teeth and gums normal, {Blank multiple:19196:a:"***","normal posterior oropharynx","tonsils 2+","tonsils 3+","no tonsillar exudate","+ cobblestoning"}  Neck: Supple, symmetrical  Lungs:   {Blank multiple:19196:a:"***","clear to auscultation bilaterally","end-expiratory wheezing","wheezing throughout"}, Respirations unlabored, {Blank multiple:19196:a:"***","no coughing","intermittent dry coughing"}  Heart:  {Blank multiple:19196:a:"***","regular rate and rhythm","no murmur"}, Appears well perfused  Extremities: No edema  Skin: Skin color, texture, turgor normal, no rashes or lesions on visualized portions of skin  Neurologic: No gross deficits   Reviewed: ***  Spirometry:  Tracings reviewed. Her effort: {Blank single:19197::"Good reproducible efforts.","It was hard to get consistent efforts and there is a question as to whether this reflects a maximal maneuver.","Poor effort, data can not be interpreted.","Variable effort-results affected.","decent for first attempt at spirometry."} FVC: ***L FEV1: ***L, ***% predicted FEV1/FVC ratio: ***% Interpretation: {Blank single:19197::"Spirometry consistent with mild obstructive disease","Spirometry consistent with moderate obstructive disease","Spirometry consistent with severe obstructive disease","Spirometry consistent with possible restrictive disease","Spirometry consistent with mixed obstructive and restrictive disease","Spirometry uninterpretable due to technique","Spirometry consistent with normal pattern","No overt abnormalities noted given today's efforts"}.  Please see scanned spirometry results for details.  Skin Testing: {Blank single:19197::"Select foods","Environmental allergy panel","Environmental allergy panel and select foods","Food allergy panel","None","Deferred due to recent antihistamines use","deferred due to recent reaction"}. ***Adequate positive and negative controls Results discussed with  patient/family.   {Blank single:19197::"Allergy testing results were read and interpreted by myself, documented by clinical staff."," "}  Assessment/Plan   ***  Tonny Bollman, MD  [facility]

## 2021-10-16 ENCOUNTER — Ambulatory Visit: Payer: BC Managed Care – PPO | Admitting: Internal Medicine

## 2021-10-31 IMAGING — CT CT ABD-PELV W/ CM
2 of 5 series · 15 of 46 positions shown, 17 images · IV contrast (APPLIED)
Comparison: CT scan 07/08/2011

CLINICAL DATA: Bilateral lower abdominal pain.

EXAM:
CT ABDOMEN AND PELVIS WITH CONTRAST
TECHNIQUE: Multidetector CT imaging of the abdomen and pelvis was performed
using the standard protocol following bolus administration of
intravenous contrast.
CONTRAST:  100mL OMNIPAQUE IOHEXOL 300 MG/ML  SOLN

[Series 3: axial st · axial · 0.80mm/px · z∈[-573,-183]mm · 12 of 88 slices shown, 14 images]
[im 5/88  soft-tissue]
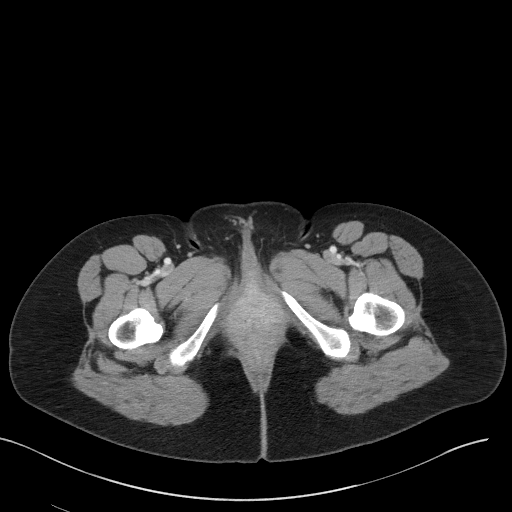
[im 5/88  bone]
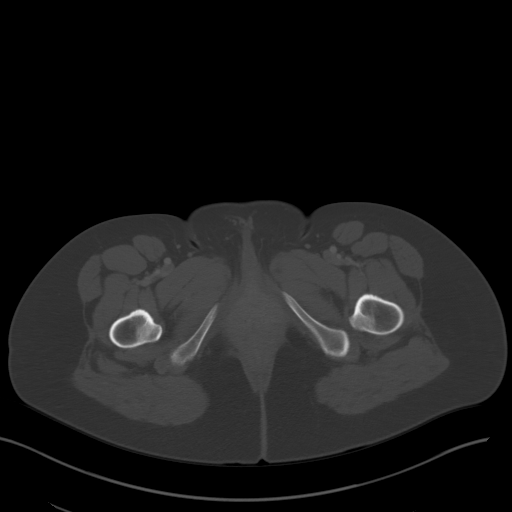
[im 14/88  soft-tissue]
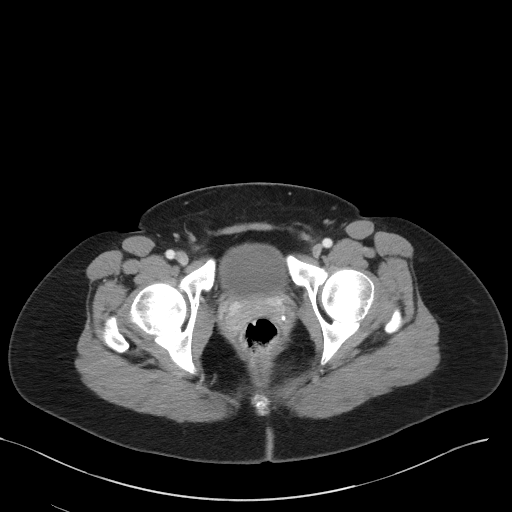
[im 19/88  soft-tissue]
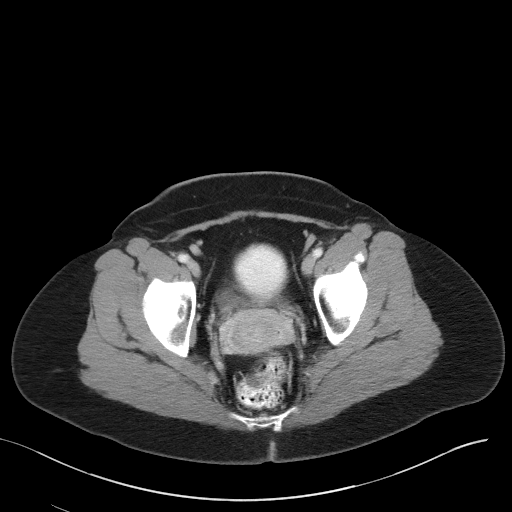
[im 28/88  soft-tissue]
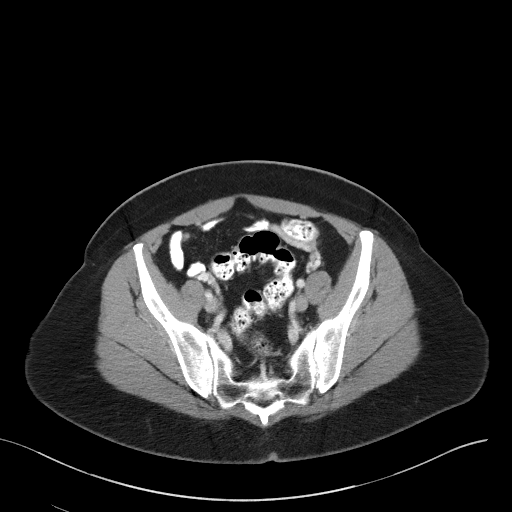
[im 33/88  soft-tissue]
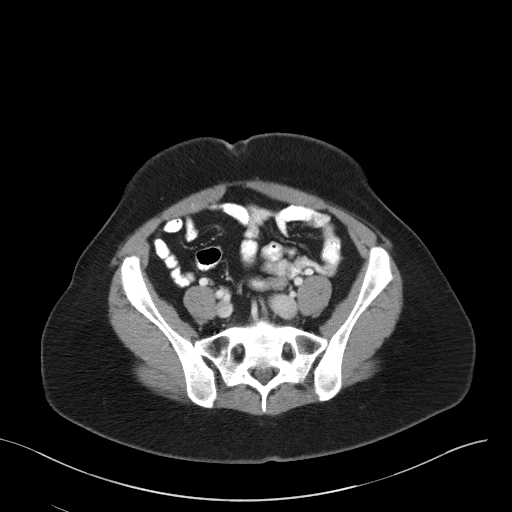
[im 42/88  soft-tissue]
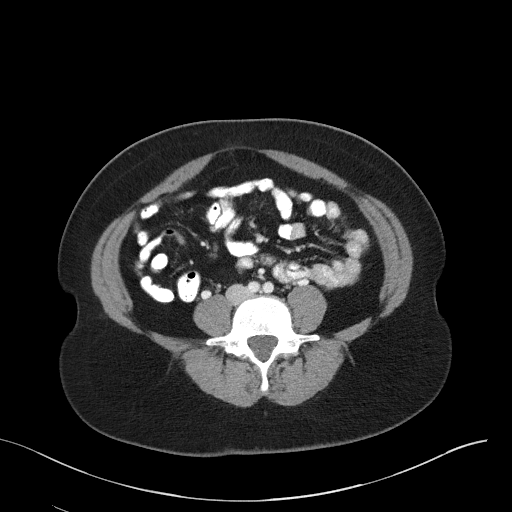
[im 46/88  soft-tissue]
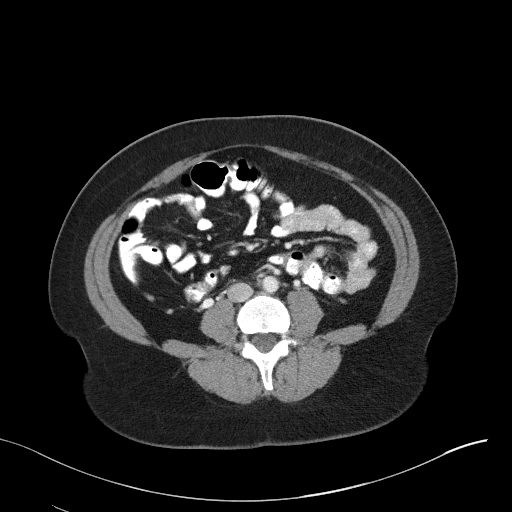
[im 55/88  soft-tissue]
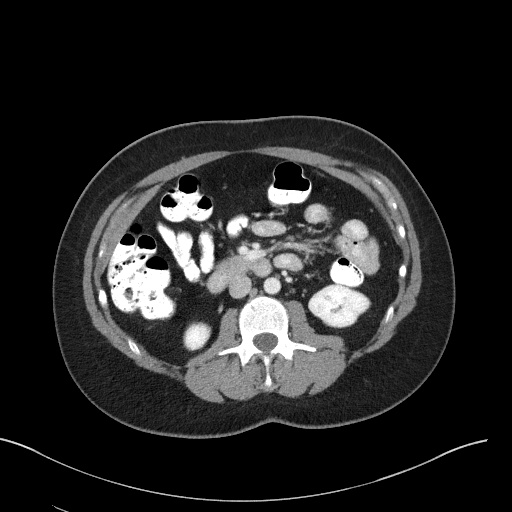
[im 60/88  soft-tissue]
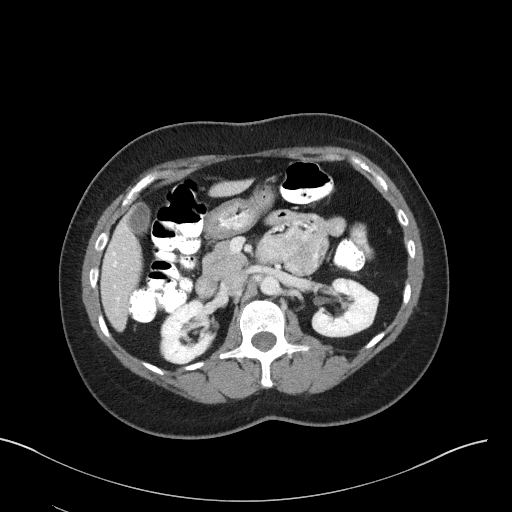
[im 60/88  bone]
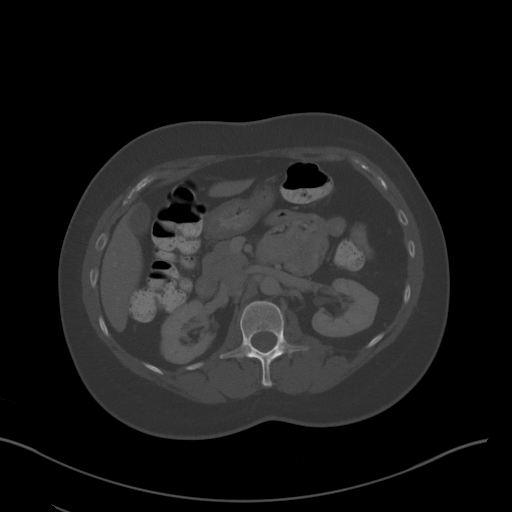
[im 69/88  soft-tissue]
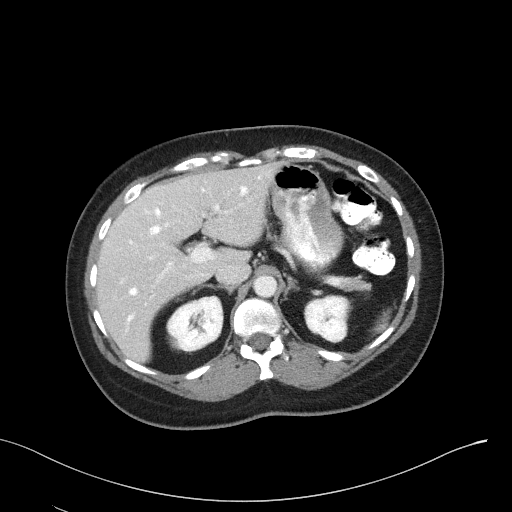
[im 74/88  soft-tissue]
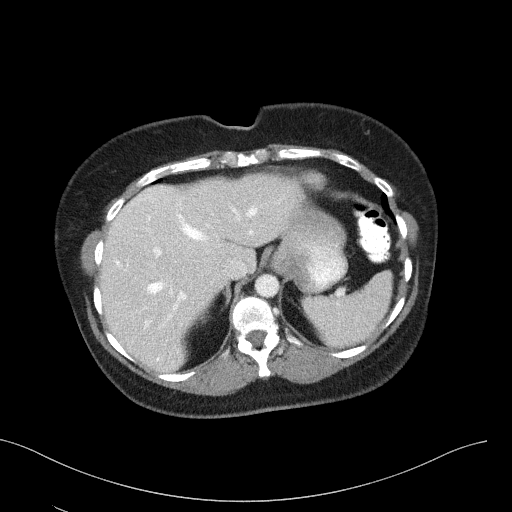
[im 83/88  soft-tissue]
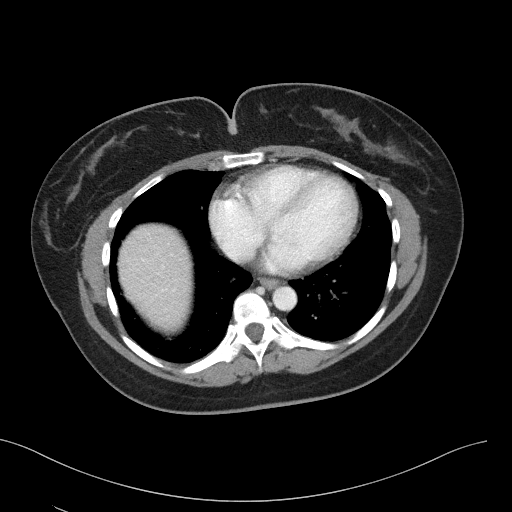

[Series 6: coronal st · coronal · 0.57mm/px · 3 of 89 slices shown]
[im 30/89  soft-tissue]
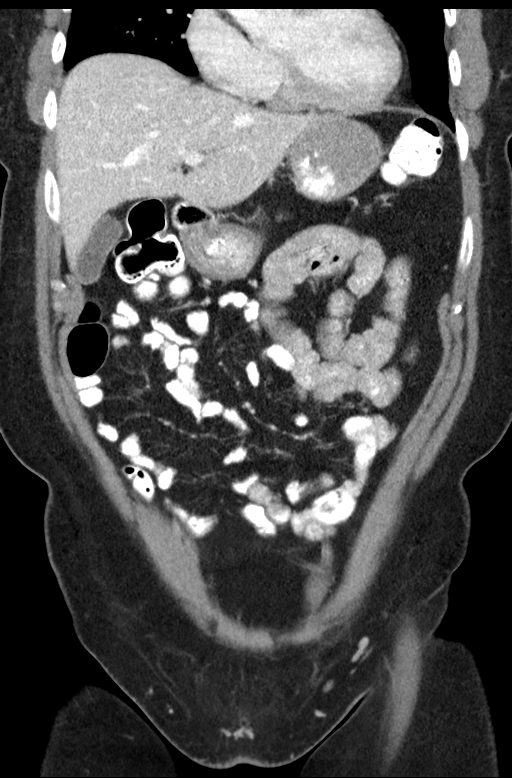
[im 40/89  soft-tissue]
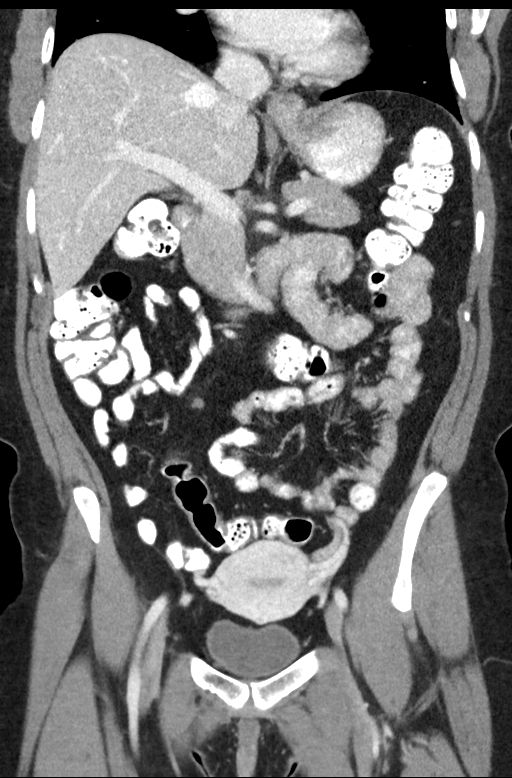
[im 49/89  soft-tissue]
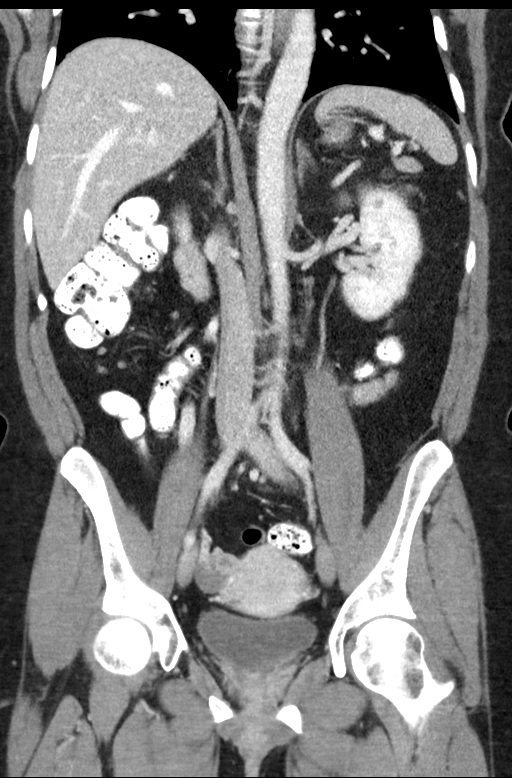

[15 of 46 positions shown; findings below may reference images not displayed]

FINDINGS: Lower chest: Insert lung bases

Hepatobiliary: Small stable segment 6 cyst. No worrisome hepatic
lesions or intrahepatic biliary dilatation. The gallbladder is
normal. No common bile duct dilatation.

Pancreas: No mass, inflammation or ductal dilatation.

Spleen: Normal size. No focal lesions.

Adrenals/Urinary Tract: The adrenal glands are unremarkable.

Small bilateral renal cysts. No worrisome renal lesions. No
hydroureteronephrosis. The bladder is unremarkable.

Stomach/Bowel: Small hiatal hernia noted. The stomach, duodenum,
small bowel and colon are unremarkable. No acute inflammatory
changes, mass lesions or obstructive findings. The terminal ileum is
normal. The appendix is normal.

Vascular/Lymphatic: The aorta is normal in caliber. No dissection.
The branch vessels are patent. The major venous structures are
patent. No mesenteric or retroperitoneal mass or adenopathy. Small
scattered lymph nodes are noted.

Reproductive: The uterus and ovaries are unremarkable. Prominent
parametrial vessels could suggest pelvic congestion syndrome.

Other: No pelvic mass or adenopathy. No free pelvic fluid
collections. No inguinal mass or adenopathy. Small periumbilical
abdominal wall hernia containing fat.

Musculoskeletal: No significant bony findings.
IMPRESSION: 1. No acute abdominal/pelvic findings, mass lesions or adenopathy.
2. No renal calculi or obstruction.
3. Prominent parametrial vessels could suggest pelvic congestion
syndrome.
4. Small hiatal hernia.

## 2022-03-09 ENCOUNTER — Encounter: Payer: Self-pay | Admitting: Family Medicine

## 2022-04-14 ENCOUNTER — Other Ambulatory Visit: Payer: Self-pay | Admitting: Family Medicine

## 2022-04-14 DIAGNOSIS — N644 Mastodynia: Secondary | ICD-10-CM

## 2022-04-29 ENCOUNTER — Ambulatory Visit
Admission: RE | Admit: 2022-04-29 | Discharge: 2022-04-29 | Disposition: A | Discharge status: Home / Self Care | Payer: BC Managed Care – PPO | Source: Ambulatory Visit | Attending: Family Medicine | Admitting: Family Medicine

## 2022-04-29 ENCOUNTER — Ambulatory Visit: Payer: BC Managed Care – PPO

## 2022-04-29 DIAGNOSIS — N644 Mastodynia: Secondary | ICD-10-CM

## 2022-07-20 ENCOUNTER — Encounter: Payer: Self-pay | Admitting: *Deleted
